# Patient Record
Sex: Female | Born: 1960 | Race: White | Hispanic: No | Marital: Single | Smoking: Current every day smoker
Health system: Southern US, Community
[De-identification: ages and names within clinical notes are randomized; demographics above are authoritative.]

---

## 2013-09-05 ENCOUNTER — Encounter (HOSPITAL_COMMUNITY): Payer: Self-pay | Admitting: Emergency Medicine

## 2013-09-05 ENCOUNTER — Emergency Department (HOSPITAL_COMMUNITY)
Admission: EM | Admit: 2013-09-05 | Discharge: 2013-09-05 | Discharge status: Against Medical Advice | Payer: Self-pay | Attending: Emergency Medicine | Admitting: Emergency Medicine

## 2013-09-05 ENCOUNTER — Emergency Department (HOSPITAL_COMMUNITY): Payer: Self-pay

## 2013-09-05 DIAGNOSIS — R911 Solitary pulmonary nodule: Secondary | ICD-10-CM | POA: Insufficient documentation

## 2013-09-05 DIAGNOSIS — Z79899 Other long term (current) drug therapy: Secondary | ICD-10-CM | POA: Insufficient documentation

## 2013-09-05 DIAGNOSIS — R110 Nausea: Secondary | ICD-10-CM | POA: Insufficient documentation

## 2013-09-05 DIAGNOSIS — R1033 Periumbilical pain: Secondary | ICD-10-CM | POA: Insufficient documentation

## 2013-09-05 DIAGNOSIS — Z880 Allergy status to penicillin: Secondary | ICD-10-CM | POA: Insufficient documentation

## 2013-09-05 DIAGNOSIS — F17200 Nicotine dependence, unspecified, uncomplicated: Secondary | ICD-10-CM | POA: Insufficient documentation

## 2013-09-05 DIAGNOSIS — L988 Other specified disorders of the skin and subcutaneous tissue: Secondary | ICD-10-CM | POA: Insufficient documentation

## 2013-09-05 DIAGNOSIS — R109 Unspecified abdominal pain: Secondary | ICD-10-CM | POA: Insufficient documentation

## 2013-09-05 LAB — URINALYSIS W MICROSCOPIC + REFLEX CULTURE
Bilirubin Urine: NEGATIVE
Glucose, UA: NEGATIVE mg/dL
Ketones, ur: NEGATIVE mg/dL
Nitrite: NEGATIVE
Urobilinogen, UA: 0.2 mg/dL (ref 0.0–1.0)
pH: 6.5 (ref 5.0–8.0)

## 2013-09-05 LAB — TROPONIN I: Troponin I: <0.3 ng/mL (ref <0.30)

## 2013-09-05 LAB — CBC WITH DIFFERENTIAL/PLATELET
Eosinophils Relative: 0 % (ref 0–5)
Lymphocytes Relative: 19 % (ref 12–46)
Lymphs Abs: 2.3 10*3/uL (ref 0.7–4.0)
MCV: 87.8 fL (ref 78.0–100.0)
Neutro Abs: 9.4 10*3/uL — ABNORMAL HIGH (ref 1.7–7.7)
Neutrophils Relative %: 75 % (ref 43–77)
Platelets: 267 10*3/uL (ref 150–400)
RBC: 4.75 MIL/uL (ref 3.87–5.11)
WBC: 12.4 10*3/uL — ABNORMAL HIGH (ref 4.0–10.5)

## 2013-09-05 MED ORDER — ONDANSETRON HCL 4 MG PO TABS: 4.0000 mg | ORAL_TABLET | Freq: Four times a day (QID) | ORAL | Status: DC

## 2013-09-05 MED ORDER — PROMETHAZINE HCL 25 MG RE SUPP: 25.0000 mg | Freq: Four times a day (QID) | RECTAL | Status: DC | PRN

## 2013-09-05 MED ORDER — SODIUM CHLORIDE 0.9 % IV SOLN
INTRAVENOUS | Status: DC
  Administered 2013-09-05 (×2): via INTRAVENOUS

## 2013-09-05 MED ORDER — ONDANSETRON HCL 4 MG/2ML IJ SOLN
4.0000 mg | Freq: Once | INTRAMUSCULAR | Status: AC
  Administered 2013-09-05: 4 mg via INTRAVENOUS
  Filled 2013-09-05: qty 2

## 2013-09-05 MED ORDER — ONDANSETRON 4 MG PO TBDP: 4.0000 mg | ORAL_TABLET | Freq: Once | ORAL | Status: DC

## 2013-09-05 NOTE — ED Provider Notes (Signed)
CSN: [id number]     Arrival date & time 09/05/13  1503 History   First MD Initiated Contact with Patient 09/05/13 1754     Chief Complaint  Patient presents with  . Nausea   (Consider location/radiation/quality/duration/timing/severity/associated sxs/prior Treatment) HPI Comments: The patient is a 52 year-old female with a past medical history of tobacco use, presenting the Emergency Department with a chief complaint of nausea for one week.  She reports one episode of non bloody emesis  6 days ago.  She reports an associated loss of appetite with decrease oral intake. The patient reports that the nausea does not increase with food. Reports last BM today, denies black stools, frank blood or pus.  She denies associated abdominal pain.  The patient states she has not been evaluated by a medical professional due to lack of insurance, and she fears that the nausea is caused by cancer.  She endorses a 10 lb weight loss which she attributes to her diet. She also states she is "cutting down on her smoking".  She reports a single episode of substernal chest discomfort yesterday while she was laying down, she describes the discomfort as pressure lasting for 1 hour which self resolved. She denies radiation to jaw or upper extremities. She denies palpitations, shortness of breath, or diaphoresis. The patient denies reoccurrence.   The history is provided by the patient.    History reviewed. No pertinent past medical history. History reviewed. No pertinent past surgical history. History reviewed. No pertinent family history. History  Substance Use Topics  . Smoking status: Current Every Day Smoker    Types: Cigarettes  . Smokeless tobacco: Not on file  . Alcohol Use: Yes     Comment: occ   OB History   Grav Para Term Preterm Abortions TAB SAB Ect Mult Living                 Review of Systems  All other systems reviewed and are negative.    Allergies  Penicillins  Home Medications    Current Outpatient Rx  Name  Route  Sig  Dispense  Refill  . doxepin (SINEQUAN) 10 MG capsule   Oral   Take 10 mg by mouth at bedtime.         . DULoxetine (CYMBALTA) 30 MG capsule   Oral   Take 30 mg by mouth 3 (three) times daily.         Marland Kitchen gabapentin (NEURONTIN) 300 MG capsule   Oral   Take 300 mg by mouth 3 (three) times daily.          BP 108/73  Pulse 80  Temp(Src) 98.6 F (37 C) (Oral)  Resp 20  SpO2 98% Physical Exam  Nursing note and vitals reviewed. Constitutional: She appears well-developed and well-nourished.  HENT:  Head: Normocephalic and atraumatic.  Mouth/Throat: Uvula is midline. No posterior oropharyngeal edema or posterior oropharyngeal erythema.  Eyes: Pupils are equal, round, and reactive to light. No scleral icterus.  Neck: Neck supple. No JVD present.  Cardiovascular: Normal rate, regular rhythm and normal heart sounds.   No murmur heard. Patient was not tachycardic during exam  Pulmonary/Chest: Effort normal and breath sounds normal. No respiratory distress. She has no wheezes. She has no rales. She exhibits no tenderness.  Abdominal: Soft. Normal appearance and bowel sounds are normal. She exhibits no distension. There is tenderness in the periumbilical area and suprapubic area. There is no rigidity, no rebound, no guarding, no CVA tenderness and no  tenderness at McBurney's point.  Musculoskeletal: She exhibits no edema.  Neurological: She is alert.  Skin: Skin is warm and dry. Nails show no clubbing.  Excoriations to Upper extremities. Back, torso and abdomen   Psychiatric: She has a normal mood and affect.    ED Course  Procedures (including critical care time) Labs Review Labs Reviewed  URINALYSIS W MICROSCOPIC + REFLEX CULTURE - Abnormal; Notable for the following:    APPearance CLOUDY (*)    Squamous Epithelial / LPF MANY (*)    All other components within normal limits  CBC WITH DIFFERENTIAL - Abnormal; Notable for the  following:    WBC 12.4 (*)    Neutro Abs 9.4 (*)    All other components within normal limits  TROPONIN I  COMPREHENSIVE METABOLIC PANEL  LIPASE, BLOOD   Imaging Review Dg Abd Acute W/chest  09/05/2013   CLINICAL DATA:  Nausea for 1 week.  EXAM: ACUTE ABDOMEN SERIES (ABDOMEN 2 VIEW & CHEST 1 VIEW)  COMPARISON:  None.  FINDINGS: A 0.8 cm nodular opacity projects in the right mid lung zone. The lungs otherwise appear clear. Heart size is normal. No pneumothorax or pleural fluid.  There is no free intraperitoneal air. The bowel gas pattern is normal. No focal bony abnormality is identified.  IMPRESSION: 0.8 cm right mid lung nodule. Chest CT with contrast is recommended for further evaluation.  No acute finding chest or abdomen.   Electronically Signed   By: Drusilla Kanner M.D.   On: 09/05/2013 20:15    EKG Interpretation   None       MDM   1. Nausea   2. Lung nodule    Patient with a 1 week history of nausea with one episode of vomiting 6 days ago.  Patient is drinking a diet coke in the room. PE minimal tenderness to periumbilical area and suprapubic. Labs and imaging sent.   troponin negative. UA-negative for infection.   2018 patient reports nausea has improved.  She reports she is "ready to go home".  Discussed with patient that all of her labs and image readings have not been completed.  PO challenge and orthostatics ordered.  Chest XR with 0.8 cm nodule in right mid lung.  Will have patient follow up with a PCP.   Patient requesting to leave and refuses to wait on last lab test, because "she has been here all afternoon" and "I have to take care of my dogs"  Discussed importance of allowing [place] to complete the evaluation and gave her return instructions if she changes her mind and wants to complete the work up.  Meds given in ED:  Medications  ondansetron (ZOFRAN) injection 4 mg (4 mg Intravenous Given 09/05/13 1842)    Discharge Medication List as of 09/05/2013  9:00 PM     START taking these medications   Details  ondansetron (ZOFRAN) 4 MG tablet Take 1 tablet (4 mg total) by mouth every 6 (six) hours., Starting 09/05/2013, Until Discontinued, Print    promethazine (PHENERGAN) 25 MG suppository Place 1 suppository (25 mg total) rectally every 6 (six) hours as needed for nausea or vomiting., Starting 09/05/2013, Until Discontinued, Print          Clabe Seal, PA-C 09/06/13 1615

## 2013-09-05 NOTE — ED Notes (Signed)
Taking Po  Liquid without any problem.

## 2013-09-05 NOTE — ED Notes (Signed)
Pt reports nausea x 1 week, has vomiting one time last week and generalized body pain. Denies any diarrhea, abd pain or other symptoms. No acute distress noted at triage.

## 2013-09-08 NOTE — ED Provider Notes (Signed)
Medical screening examination/treatment/procedure(s) were performed by non-physician practitioner and as supervising physician I was immediately available for consultation/collaboration.  EKG Interpretation   None         Anelis Hrivnak M Khairi Garman, DO 09/08/13 1405 

## 2013-10-23 ENCOUNTER — Encounter (HOSPITAL_COMMUNITY): Payer: Self-pay | Admitting: Emergency Medicine

## 2013-10-23 ENCOUNTER — Emergency Department (HOSPITAL_COMMUNITY)
Admission: EM | Admit: 2013-10-23 | Discharge: 2013-10-23 | Disposition: A | Discharge status: Home / Self Care | Payer: Self-pay | Attending: Emergency Medicine | Admitting: Emergency Medicine

## 2013-10-23 ENCOUNTER — Emergency Department (HOSPITAL_COMMUNITY): Payer: Self-pay

## 2013-10-23 DIAGNOSIS — S0512XA Contusion of eyeball and orbital tissues, left eye, initial encounter: Secondary | ICD-10-CM

## 2013-10-23 DIAGNOSIS — S82839A Other fracture of upper and lower end of unspecified fibula, initial encounter for closed fracture: Secondary | ICD-10-CM | POA: Insufficient documentation

## 2013-10-23 DIAGNOSIS — Z880 Allergy status to penicillin: Secondary | ICD-10-CM | POA: Insufficient documentation

## 2013-10-23 DIAGNOSIS — W07XXXA Fall from chair, initial encounter: Secondary | ICD-10-CM | POA: Insufficient documentation

## 2013-10-23 DIAGNOSIS — Y92009 Unspecified place in unspecified non-institutional (private) residence as the place of occurrence of the external cause: Secondary | ICD-10-CM | POA: Insufficient documentation

## 2013-10-23 DIAGNOSIS — S0510XA Contusion of eyeball and orbital tissues, unspecified eye, initial encounter: Secondary | ICD-10-CM | POA: Insufficient documentation

## 2013-10-23 DIAGNOSIS — W19XXXA Unspecified fall, initial encounter: Secondary | ICD-10-CM

## 2013-10-23 DIAGNOSIS — S82832A Other fracture of upper and lower end of left fibula, initial encounter for closed fracture: Secondary | ICD-10-CM

## 2013-10-23 DIAGNOSIS — Z79899 Other long term (current) drug therapy: Secondary | ICD-10-CM | POA: Insufficient documentation

## 2013-10-23 DIAGNOSIS — F17200 Nicotine dependence, unspecified, uncomplicated: Secondary | ICD-10-CM | POA: Insufficient documentation

## 2013-10-23 DIAGNOSIS — S0003XA Contusion of scalp, initial encounter: Secondary | ICD-10-CM | POA: Insufficient documentation

## 2013-10-23 DIAGNOSIS — Y9389 Activity, other specified: Secondary | ICD-10-CM | POA: Insufficient documentation

## 2013-10-23 MED ORDER — HYDROCODONE-ACETAMINOPHEN 5-325 MG PO TABS: 1.0000 | ORAL_TABLET | ORAL | Status: AC | PRN

## 2013-10-23 MED ORDER — HYDROCODONE-ACETAMINOPHEN 5-325 MG PO TABS
1.0000 | ORAL_TABLET | Freq: Once | ORAL | Status: AC
  Administered 2013-10-23: 1 via ORAL
  Filled 2013-10-23: qty 1

## 2013-10-23 MED ORDER — HYDROCODONE-ACETAMINOPHEN 5-325 MG PO TABS: 1.0000 | ORAL_TABLET | ORAL | Status: DC | PRN

## 2013-10-23 NOTE — ED Notes (Signed)
Voiced understanding of instructions given 

## 2013-10-23 NOTE — Progress Notes (Signed)
[facility] provided pt with a list of primary care resources, [facility] [employer] application, and [facility] information.  °

## 2013-10-23 NOTE — ED Notes (Addendum)
Pt states fell onto side in kitchen, right eye blackened, states feet got tangled, c/o pain all over right side, states she is using cane to walk due to pain

## 2013-10-23 NOTE — ED Provider Notes (Signed)
Medical screening examination/treatment/procedure(s) were performed by non-physician practitioner and as supervising physician I was immediately available for consultation/collaboration.  EKG Interpretation   None         Junius Argyle, MD 10/23/13 [phone]

## 2013-10-23 NOTE — ED Provider Notes (Signed)
CSN: [id number]     Arrival date & time 10/23/13  4098 History   First MD Initiated Contact with Patient 10/23/13 1002     Chief Complaint  Patient presents with  . Fall   (Consider location/radiation/quality/duration/timing/severity/associated sxs/prior Treatment) HPI Patient reports she accidentally fell yesterday while sitting at a tall pub table in her home.  States she got her foot caught and accidentally knocked the entire thing over, landing on her left face and left leg.  Reports pain and brusing just beneath her left eye and pain in her left lower leg and calf.  Pain is 9/10.  Has taken ibuprofen at home without improvement.  Denies syncope, confusion, dizziness after fall.  Denies weakness or numbness of the extremities.  Vision is unchanged.  History reviewed. No pertinent past medical history. History reviewed. No pertinent past surgical history. History reviewed. No pertinent family history. History  Substance Use Topics  . Smoking status: Current Every Day Smoker    Types: Cigarettes  . Smokeless tobacco: Not on file  . Alcohol Use: Yes     Comment: occ   OB History   Grav Para Term Preterm Abortions TAB SAB Ect Mult Living                 Review of Systems  Eyes: Negative for visual disturbance.  Respiratory: Negative for shortness of breath.   Cardiovascular: Negative for chest pain.  Musculoskeletal: Negative for back pain and neck pain.  Skin: Positive for color change.  Allergic/Immunologic: Negative for immunocompromised state.  Neurological: Negative for syncope, weakness, numbness and headaches.    Allergies  Penicillins  Home Medications   Current Outpatient Rx  Name  Route  Sig  Dispense  Refill  . doxepin (SINEQUAN) 10 MG capsule   Oral   Take 10 mg by mouth at bedtime.         . DULoxetine (CYMBALTA) 30 MG capsule   Oral   Take 30 mg by mouth 3 (three) times daily.         Marland Kitchen gabapentin (NEURONTIN) 300 MG capsule   Oral   Take 300 mg  by mouth 3 (three) times daily.         Marland Kitchen ibuprofen (ADVIL,MOTRIN) 200 MG tablet   Oral   Take 800 mg by mouth every 6 (six) hours as needed.          BP 124/100  Pulse 92  Temp(Src) 99.1 F (37.3 C) (Oral)  Resp 20  SpO2 100% Physical Exam  Nursing note and vitals reviewed. Constitutional: She appears well-developed and well-nourished. No distress.  HENT:  Head: Normocephalic. Head is with contusion. Head is without abrasion.    Eyes: Conjunctivae and EOM are normal.  Neck: Normal range of motion. Neck supple.  Cardiovascular: Normal rate.   Pulmonary/Chest: Effort normal.  Musculoskeletal: Normal range of motion. She exhibits tenderness. She exhibits no edema.       Right knee: Normal.       Left knee: She exhibits normal range of motion, no swelling, no effusion, no ecchymosis, no deformity, no laceration, no erythema, normal alignment, no LCL laxity and no MCL laxity. Tenderness found.       Right ankle: Normal.       Left ankle: Normal.       Legs:      Left foot: Normal.  Lower extremities:  Strength 5/5, sensation intact, distal pulses intact.     Neurological: She is alert.  CN II-XII  intact, EOMs intact, no pronator drift, grip strengths equal bilaterally; strength 5/5 in all extremities, sensation intact in all extremities; finger to nose, heel to shin, rapid alternating movements normal; gait is limping but otherwise normal.     Skin: She is not diaphoretic.    ED Course  Procedures (including critical care time) Labs Review Labs Reviewed - No data to display Imaging Review Dg Knee Complete 4 Views Left  10/23/2013   CLINICAL DATA:  52 year old female status post fall with pain. Initial encounter.  EXAM: LEFT KNEE - COMPLETE 4+ VIEW  COMPARISON:  None.  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 No acute tibia fracture identified. Patella appears intact. No joint effusion or distal femur fracture identified.  IMPRESSION: Acute on chronic appearing proximal left fibula fracture, nondisplaced.   Electronically Signed   By: Augusto Gamble M.D.   On: 10/23/2013 11:06    EKG Interpretation   None       MDM   1. Fracture of left proximal fibula, closed, initial encounter   2. Fall, initial encounter   3. Periorbital contusion of left eye, initial encounter      Pt with accidental fall from chair yesterday, pain uncontrolled with home medications.  Neurologically  Intact, doubt intracranial injury.  No hx confusion, syncope, symptoms following fall to suggest concussion.  No bony tenderness of face and EOMs intact, doubt fracture.  Left knee and proximal shin tender to palpation - xrays show acute on chronic fibular fracture.   Neurovascularly intact.  Patient placed in knee immobilizer and given crutches. D/C home with norco, ortho follow up Discussed result, findings, treatment, and follow up  with patient.  Pt given return precautions.  Pt verbalizes understanding and agrees with plan.       Trixie Dredge, PA-C 10/23/13 1136

## 2014-12-10 IMAGING — CR DG ABDOMEN ACUTE W/ 1V CHEST
3 series · 3 of 3 positions shown · non-contrast
Comparison: None.

CLINICAL DATA: Nausea for 1 week.

EXAM:
ACUTE ABDOMEN SERIES (ABDOMEN 2 VIEW & CHEST 1 VIEW)

[w chest pa]
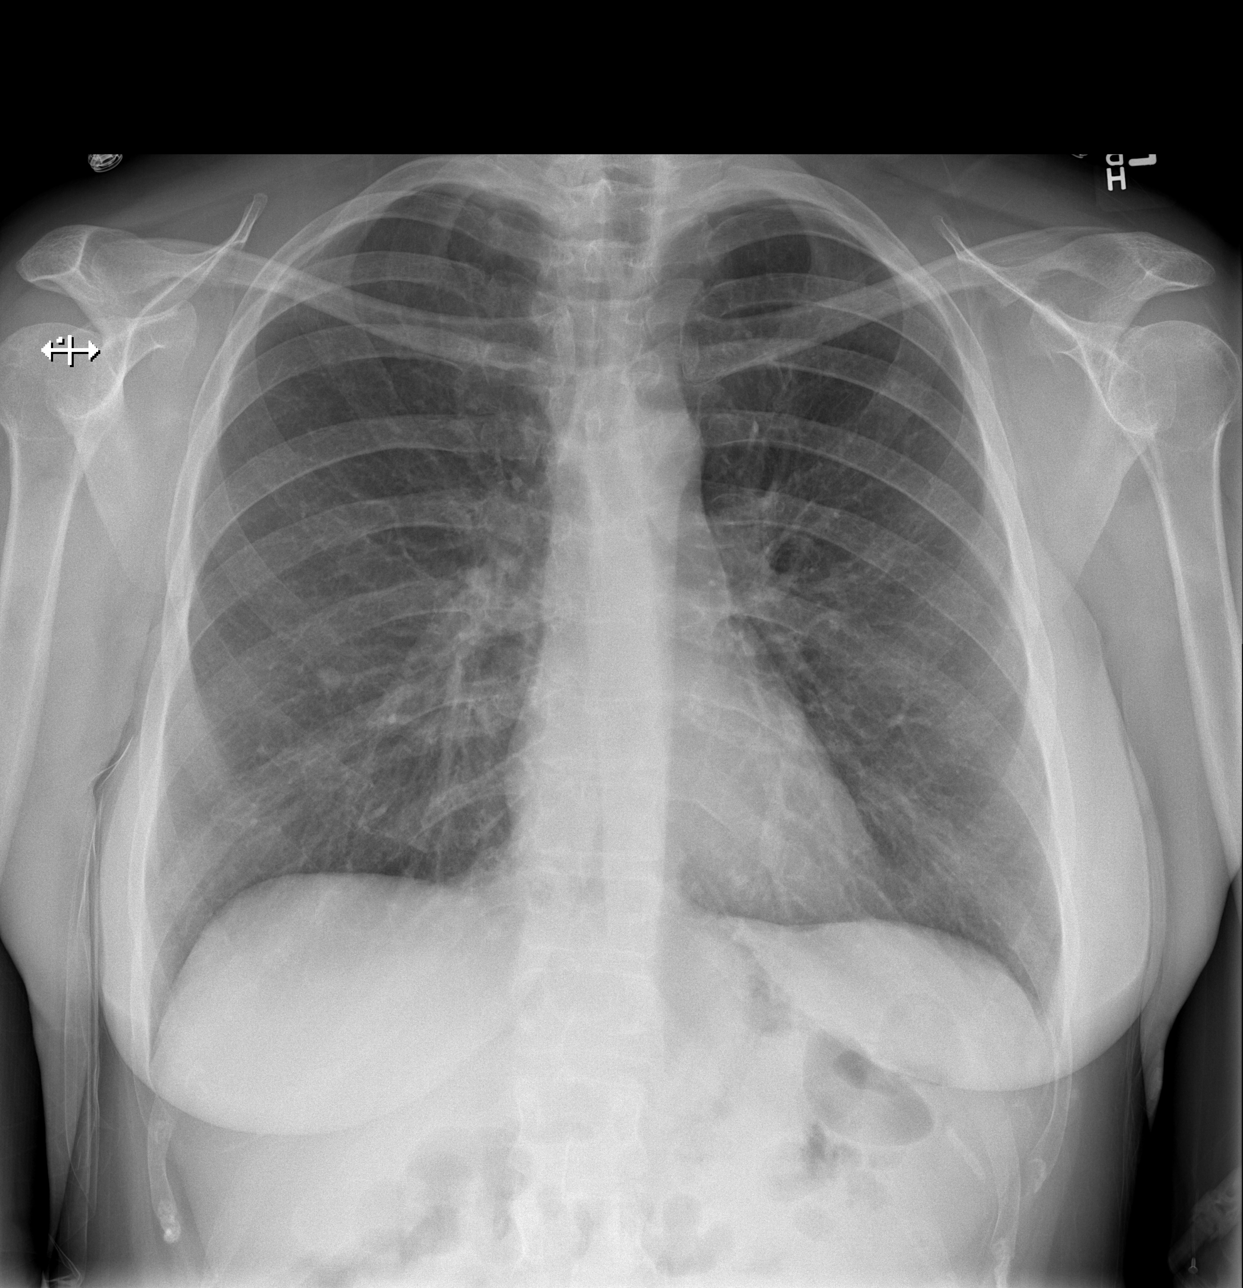

[w abdomen upright]
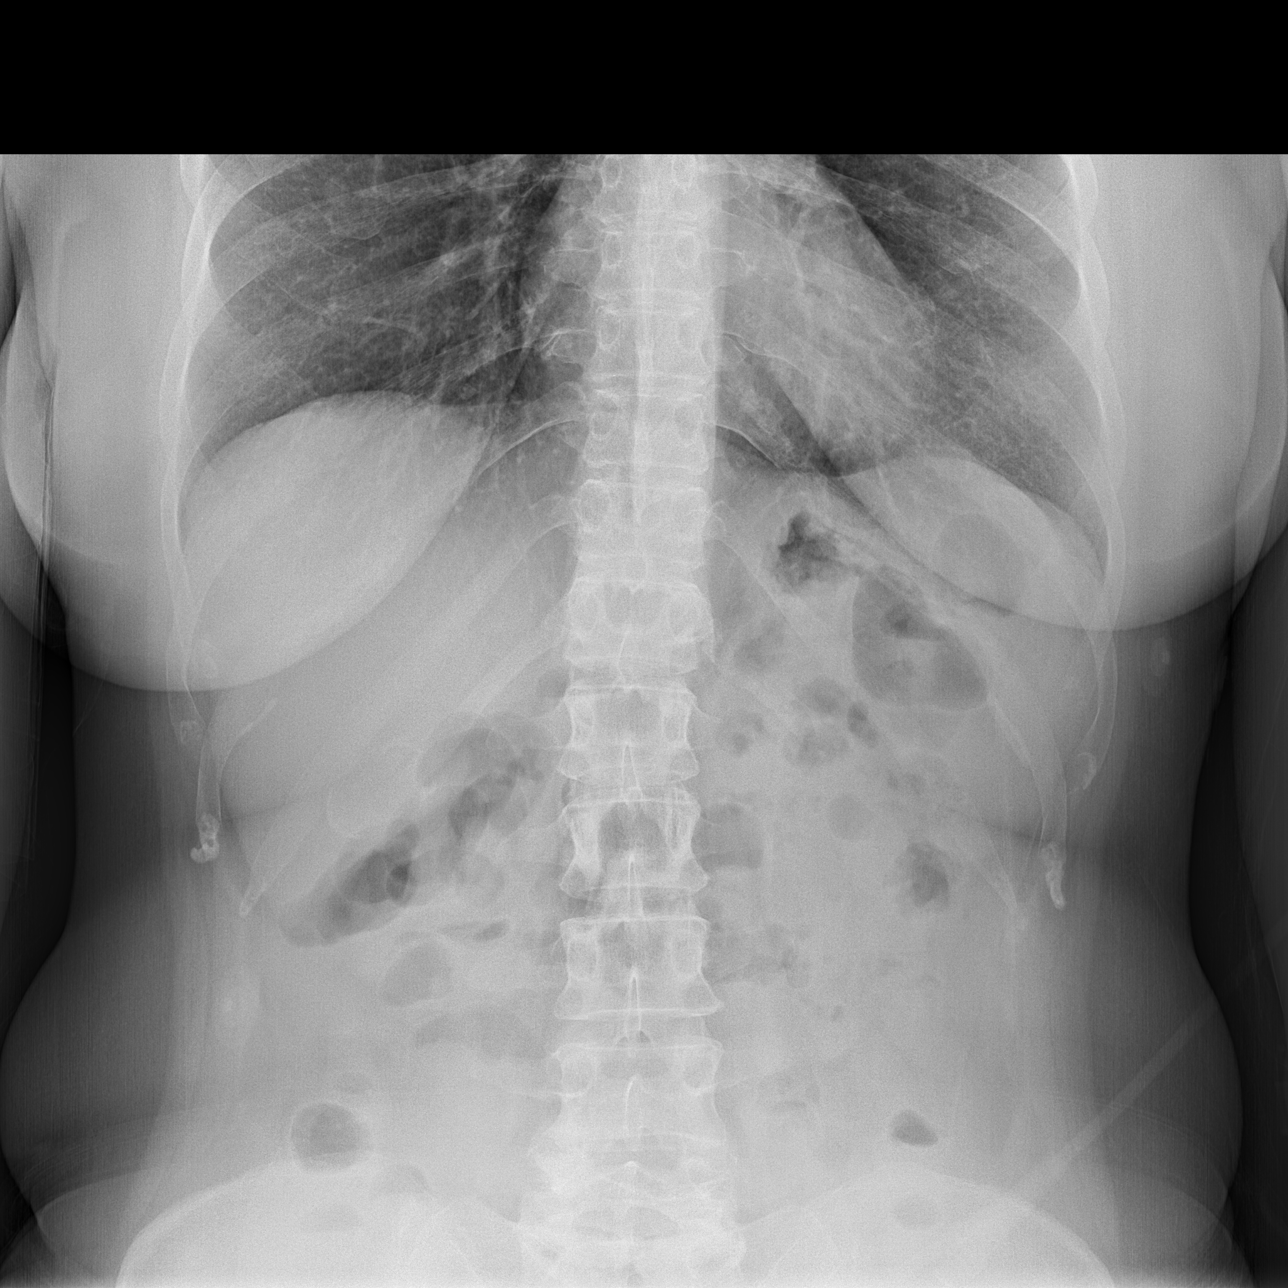

[t abdomen supine]
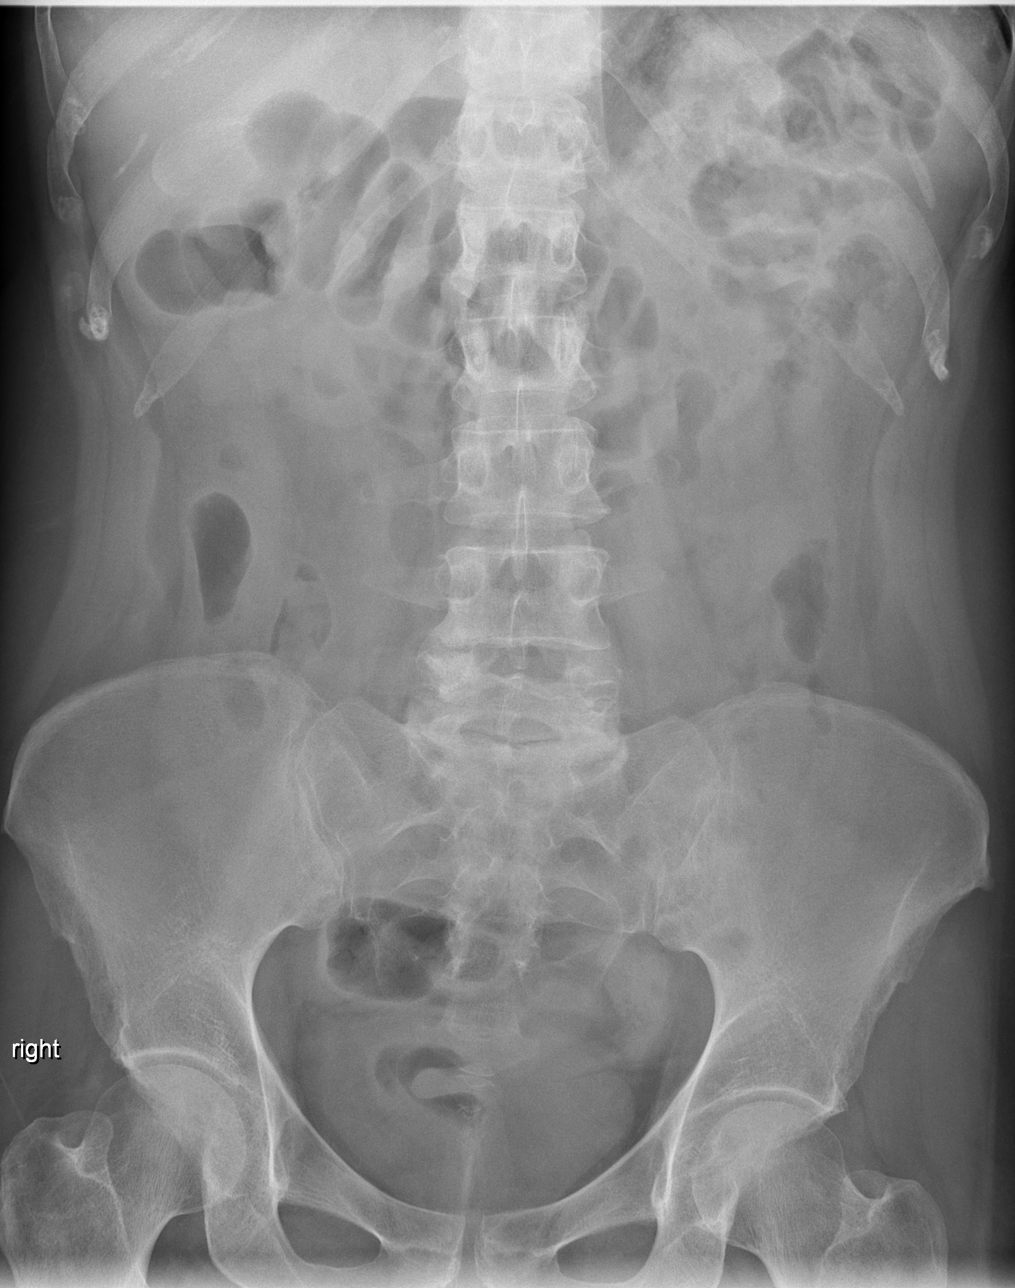

[3 of 3 positions shown; findings below may reference images not displayed]

FINDINGS: A 0.8 cm nodular opacity projects in the right mid lung zone. The
lungs otherwise appear clear. Heart size is normal. No pneumothorax
or pleural fluid.

There is no free intraperitoneal air. The bowel gas pattern is
normal. No focal bony abnormality is identified.
IMPRESSION: 0.8 cm right mid lung nodule. Chest CT with contrast is recommended
for further evaluation.

No acute finding chest or abdomen.

## 2018-01-26 ENCOUNTER — Telehealth: Payer: Self-pay | Admitting: Nurse Practitioner

## 2018-01-26 NOTE — Telephone Encounter (Signed)
Not an established patient. Called to patient assistance program. 10 minute estimated wait time. Did not hold.
